# Patient Record
Sex: Female | Born: 2000 | Hispanic: No | Marital: Single | Smoking: Never smoker
Health system: Southern US, Community
[De-identification: ages and names within clinical notes are randomized; demographics above are authoritative.]

## PROBLEM LIST (undated history)

## (undated) DIAGNOSIS — F419 Anxiety disorder, unspecified: Secondary | ICD-10-CM

## (undated) DIAGNOSIS — F988 Other specified behavioral and emotional disorders with onset usually occurring in childhood and adolescence: Secondary | ICD-10-CM

## (undated) DIAGNOSIS — J45909 Unspecified asthma, uncomplicated: Secondary | ICD-10-CM

## (undated) DIAGNOSIS — G40909 Epilepsy, unspecified, not intractable, without status epilepticus: Secondary | ICD-10-CM

## (undated) DIAGNOSIS — Q02 Microcephaly: Secondary | ICD-10-CM

## (undated) DIAGNOSIS — F32A Depression, unspecified: Secondary | ICD-10-CM

## (undated) DIAGNOSIS — R6250 Unspecified lack of expected normal physiological development in childhood: Secondary | ICD-10-CM

## (undated) DIAGNOSIS — F959 Tic disorder, unspecified: Secondary | ICD-10-CM

## (undated) DIAGNOSIS — F70 Mild intellectual disabilities: Secondary | ICD-10-CM

## (undated) HISTORY — DX: Other specified behavioral and emotional disorders with onset usually occurring in childhood and adolescence: F98.8

## (undated) HISTORY — DX: Epilepsy, unspecified, not intractable, without status epilepticus: G40.909

## (undated) HISTORY — DX: Mild intellectual disabilities: F70

## (undated) HISTORY — DX: Microcephaly: Q02

## (undated) HISTORY — DX: Tic disorder, unspecified: F95.9

## (undated) HISTORY — DX: Unspecified lack of expected normal physiological development in childhood: R62.50

---
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
## 2022-01-23 ENCOUNTER — Ambulatory Visit: Payer: Medicaid Other | Admitting: Family

## 2022-02-13 ENCOUNTER — Encounter: Payer: Self-pay | Admitting: Family

## 2022-02-13 ENCOUNTER — Ambulatory Visit (INDEPENDENT_AMBULATORY_CARE_PROVIDER_SITE_OTHER): Payer: Medicaid Other | Admitting: Family

## 2022-02-13 VITALS — BP 118/75 | HR 99 | Ht <= 58 in | Wt 96.0 lb

## 2022-02-13 DIAGNOSIS — Z3042 Encounter for surveillance of injectable contraceptive: Secondary | ICD-10-CM | POA: Diagnosis not present

## 2022-02-13 MED ORDER — MEDROXYPROGESTERONE ACETATE 150 MG/ML IM SUSP
150.0000 mg | Freq: Once | INTRAMUSCULAR | Status: AC
  Administered 2022-02-13: 150 mg via INTRAMUSCULAR

## 2022-02-13 NOTE — Progress Notes (Unsigned)
Returns in depo window with no concerns for bleeding or cramping. Vitals reviewed.  Depo today; return in window or sooner if needed.    Wt Readings from Last 3 Encounters:  02/13/22 96 lb (43.5 kg) (1 %, Z= -2.19)*  12/01/21 90 lb 3.2 oz (40.9 kg) (<1 %, Z= -2.80)*  11/25/21 90 lb 6.4 oz (41 kg) (<1 %, Z= -2.78)*   * Growth percentiles are based on CDC (Girls, 2-20 Years) data.   Temp Readings from Last 3 Encounters:  04/04/21 98.3 F (36.8 C)  03/28/21 98.5 F (36.9 C) (Oral)   BP Readings from Last 3 Encounters:  02/13/22 118/75  12/01/21 120/78  04/18/21 127/76   Pulse Readings from Last 3 Encounters:  02/13/22 99  12/01/21 88  04/18/21 (!) 129

## 2022-03-06 ENCOUNTER — Ambulatory Visit (INDEPENDENT_AMBULATORY_CARE_PROVIDER_SITE_OTHER): Payer: Medicaid Other | Admitting: Pediatric Endocrinology

## 2022-03-20 ENCOUNTER — Ambulatory Visit (INDEPENDENT_AMBULATORY_CARE_PROVIDER_SITE_OTHER): Payer: Medicaid Other | Admitting: Pediatric Endocrinology

## 2022-05-02 ENCOUNTER — Ambulatory Visit (INDEPENDENT_AMBULATORY_CARE_PROVIDER_SITE_OTHER): Payer: Medicaid Other | Admitting: Family

## 2022-05-02 ENCOUNTER — Encounter: Payer: Self-pay | Admitting: Family

## 2022-05-02 VITALS — BP 115/78 | HR 94 | Ht <= 58 in | Wt 101.6 lb

## 2022-05-02 DIAGNOSIS — N9489 Other specified conditions associated with female genital organs and menstrual cycle: Secondary | ICD-10-CM

## 2022-05-02 DIAGNOSIS — Z3042 Encounter for surveillance of injectable contraceptive: Secondary | ICD-10-CM

## 2022-05-02 MED ORDER — MEDROXYPROGESTERONE ACETATE 150 MG/ML IM SUSP
150.0000 mg | Freq: Once | INTRAMUSCULAR | Status: AC
  Administered 2022-05-02: 150 mg via INTRAMUSCULAR

## 2022-05-03 ENCOUNTER — Encounter: Payer: Self-pay | Admitting: Family

## 2022-05-03 NOTE — Progress Notes (Signed)
Wt Readings from Last 3 Encounters:  05/02/22 101 lb 9.6 oz (46.1 kg)  02/13/22 96 lb (43.5 kg) (1 %, Z= -2.19)*  12/01/21 90 lb 3.2 oz (40.9 kg) (<1 %, Z= -2.80)*   * Growth percentiles are based on CDC (Girls, 2-20 Years) data.   BP Readings from Last 3 Encounters:  05/02/22 115/78   Pulse Readings from Last 3 Encounters:  05/02/22 94         Return in depo window. No concerns.  OK for depo today and return in depo window.

## 2022-06-20 ENCOUNTER — Other Ambulatory Visit: Payer: Self-pay | Admitting: Family

## 2022-06-20 DIAGNOSIS — E343 Short stature due to endocrine disorder, unspecified: Secondary | ICD-10-CM

## 2022-06-20 DIAGNOSIS — R630 Anorexia: Secondary | ICD-10-CM

## 2022-07-21 ENCOUNTER — Ambulatory Visit (INDEPENDENT_AMBULATORY_CARE_PROVIDER_SITE_OTHER): Payer: Medicaid Other | Admitting: Family

## 2022-07-21 ENCOUNTER — Encounter: Payer: Self-pay | Admitting: Family

## 2022-07-21 VITALS — BP 100/63 | HR 92 | Ht <= 58 in | Wt 105.2 lb

## 2022-07-21 DIAGNOSIS — Z3042 Encounter for surveillance of injectable contraceptive: Secondary | ICD-10-CM | POA: Diagnosis not present

## 2022-07-21 DIAGNOSIS — N9489 Other specified conditions associated with female genital organs and menstrual cycle: Secondary | ICD-10-CM

## 2022-07-21 MED ORDER — MEDROXYPROGESTERONE ACETATE 150 MG/ML IM SUSP
150.0000 mg | Freq: Once | INTRAMUSCULAR | Status: AC
  Administered 2022-07-21: 150 mg via INTRAMUSCULAR

## 2022-07-21 NOTE — Progress Notes (Signed)
Depo today with no concerns. Return in depo window.   Wt Readings from Last 3 Encounters:  07/21/22 105 lb 3.2 oz (47.7 kg)  05/02/22 101 lb 9.6 oz (46.1 kg)  02/13/22 96 lb (43.5 kg) (1 %, Z= -2.19)*   * Growth percentiles are based on CDC (Girls, 2-20 Years) data.   Temp Readings from Last 3 Encounters:  04/04/21 98.3 F (36.8 C)  03/28/21 98.5 F (36.9 C) (Oral)   BP Readings from Last 3 Encounters:  07/21/22 100/63  05/02/22 115/78  02/13/22 118/75   Pulse Readings from Last 3 Encounters:  07/21/22 92  05/02/22 94  02/13/22 99

## 2022-07-31 ENCOUNTER — Encounter (INDEPENDENT_AMBULATORY_CARE_PROVIDER_SITE_OTHER): Payer: Self-pay | Admitting: Pediatric Endocrinology

## 2022-07-31 ENCOUNTER — Ambulatory Visit (INDEPENDENT_AMBULATORY_CARE_PROVIDER_SITE_OTHER): Payer: Medicaid Other | Admitting: Pediatric Endocrinology

## 2022-07-31 VITALS — BP 110/68 | HR 84 | Ht <= 58 in | Wt 103.2 lb

## 2022-07-31 DIAGNOSIS — N926 Irregular menstruation, unspecified: Secondary | ICD-10-CM

## 2022-07-31 DIAGNOSIS — E300 Delayed puberty: Secondary | ICD-10-CM | POA: Diagnosis not present
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
## 2022-10-06 ENCOUNTER — Encounter: Payer: Medicaid Other | Admitting: Family

## 2022-10-09 ENCOUNTER — Encounter: Payer: Self-pay | Admitting: Family

## 2022-10-09 ENCOUNTER — Ambulatory Visit: Payer: Medicaid Other | Admitting: Family

## 2022-10-09 VITALS — BP 132/77 | HR 83 | Ht <= 58 in | Wt 100.2 lb

## 2022-10-09 DIAGNOSIS — R630 Anorexia: Secondary | ICD-10-CM

## 2022-10-09 DIAGNOSIS — Z3042 Encounter for surveillance of injectable contraceptive: Secondary | ICD-10-CM | POA: Diagnosis not present

## 2022-10-09 DIAGNOSIS — N9489 Other specified conditions associated with female genital organs and menstrual cycle: Secondary | ICD-10-CM

## 2022-10-09 DIAGNOSIS — E343 Short stature due to endocrine disorder, unspecified: Secondary | ICD-10-CM | POA: Diagnosis not present

## 2022-10-09 MED ORDER — MEDROXYPROGESTERONE ACETATE 150 MG/ML IM SUSP
150.0000 mg | Freq: Once | INTRAMUSCULAR | Status: AC
Start: 2022-10-09 — End: 2022-10-09
  Administered 2022-10-09: 150 mg via INTRAMUSCULAR

## 2022-10-09 MED ORDER — CYPROHEPTADINE HCL 4 MG PO TABS: ORAL_TABLET | ORAL | 3 refills | Status: AC

## 2022-10-09 NOTE — Progress Notes (Signed)
History was provided by the patient and legal guardian.  Sarah Dickerson is a 22 y.o. female who is here for depo-provera injection.   PCP confirmed? Yes.    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     atomoxetine (STRATTERA) 40 MG capsule Take 40 mg by mouth daily.     cetirizine (ZYRTEC) 10 MG tablet Take 10 mg by mouth daily.     cloNIDine (CATAPRES) 0.1 MG tablet Take 2 tablets (0.2 mg total) by mouth at bedtime. 60 tablet 2   cyproheptadine (PERIACTIN) 4 MG tablet Take 1 tablet (4 mg total) by mouth every morning AND 2 tablets (8 mg total) daily before supper. 270 tablet 3   fluticasone (FLONASE) 50 MCG/ACT nasal spray Place 1 spray into both nostrils daily.     ibuprofen (ADVIL,MOTRIN) 100 MG/5ML suspension Take 5 mg/kg by mouth every 6 (six) hours as needed.     medroxyPROGESTERone (DEPO-PROVERA) 150 MG/ML injection Inject 150 mg into the muscle every 3 (three) months.     montelukast (SINGULAIR) 5 MG chewable tablet Chew 5 mg by mouth daily.     Multiple Vitamins-Minerals (MULTIVITAMIN ADULTS) TABS Take by mouth.     mupirocin ointment (BACTROBAN) 2 % Apply  topically 2 (two) times daily.     PATADAY 0.2 % SOLN Eye Drops  11   risperiDONE (RISPERDAL) 0.25 MG tablet Take 1 tablet (0.25 mg total) by mouth at bedtime. 90 tablet 0   traZODone (DESYREL) 50 MG tablet Take 1 tablet (50 mg total) by mouth at bedtime. 90 tablet 0   triamcinolone (KENALOG) 0.1 % SMARTSIG:Sparingly Topical Twice Daily PRN     VYVANSE 40 MG capsule   0   No current facility-administered medications on file prior to visit.    No Known Allergies  Physical Exam:    Vitals:   10/09/22 0827  BP: 132/77  Pulse: 83  Weight: 100 lb 3.2 oz (45.5 kg)  Height: 4' 7.98" (1.422 m)    Growth %ile SmartLinks can only be used for patients less than 72 years old. No LMP recorded. Patient has had an injection.  Physical Exam Vitals and nursing note reviewed.  Constitutional:      General: She is not in acute distress.    Appearance: She is well-developed.  HENT:     Mouth/Throat:     Mouth: Mucous membranes are moist.  Eyes:     General: No scleral icterus.    Pupils: Pupils are equal, round, and reactive to light.  Neck:     Thyroid: No thyromegaly.  Cardiovascular:  Rate and Rhythm: Normal rate and regular rhythm.     Heart sounds: Normal heart sounds. No murmur heard. Pulmonary:     Effort: Pulmonary effort is normal.     Breath sounds: Normal breath sounds.  Musculoskeletal:        General: No tenderness. Normal range of motion.     Cervical back: Normal range of motion and neck supple.  Lymphadenopathy:     Cervical: No cervical adenopathy.  Skin:    General: Skin is warm and dry.     Findings: No rash.  Neurological:     Mental Status: She is alert and oriented to person, place, and time.     Cranial Nerves: No cranial nerve deficit.      Assessment/Plan: 1. Menstrual suppression 2. Encounter for Depo-Provera contraception -continue with method  -discussed referrals for ongoing care due to age - Dr Shawnie Pons, OB/GYN referral 07/31/22  3. Poor  appetite 4. Short stature due to endocrine disorder - cyproheptadine (PERIACTIN) 4 MG tablet; Take 1 tablet (4 mg total) by mouth every morning AND 2 tablets (8 mg total) daily before supper.  Dispense: 270 tablet; Refill: 3

## 2022-12-18 ENCOUNTER — Encounter: Payer: Self-pay | Admitting: Pediatrics

## 2022-12-18 ENCOUNTER — Ambulatory Visit (INDEPENDENT_AMBULATORY_CARE_PROVIDER_SITE_OTHER): Payer: Medicaid Other | Admitting: Family

## 2022-12-18 ENCOUNTER — Other Ambulatory Visit: Payer: Self-pay | Admitting: Family

## 2022-12-18 ENCOUNTER — Encounter: Payer: Self-pay | Admitting: Family

## 2022-12-18 VITALS — BP 123/84 | HR 109 | Ht <= 58 in | Wt 107.4 lb

## 2022-12-18 DIAGNOSIS — N9489 Other specified conditions associated with female genital organs and menstrual cycle: Secondary | ICD-10-CM | POA: Diagnosis not present

## 2022-12-18 DIAGNOSIS — Z3042 Encounter for surveillance of injectable contraceptive: Secondary | ICD-10-CM

## 2022-12-18 DIAGNOSIS — R630 Anorexia: Secondary | ICD-10-CM

## 2022-12-18 DIAGNOSIS — E343 Short stature due to endocrine disorder, unspecified: Secondary | ICD-10-CM

## 2022-12-18 MED ORDER — MEDROXYPROGESTERONE ACETATE 150 MG/ML IM SUSP
150.0000 mg | Freq: Once | INTRAMUSCULAR | Status: AC
Start: 2022-12-18 — End: 2022-12-18
  Administered 2022-12-18: 150 mg via INTRAMUSCULAR

## 2022-12-18 NOTE — Progress Notes (Signed)
Pt presents for depo injection. Pt within depo window, no urine hcg needed.  Injection given, tolerated well. F/u depo injection visit scheduled.  She is due for DXA; order placed.   Patient last 3 weights:  Wt Readings from Last 3 Encounters:  12/18/22 107 lb 6.4 oz (48.7 kg)  10/09/22 100 lb 3.2 oz (45.5 kg)  07/31/22 103 lb 3.2 oz (46.8 kg)     Last Blood Pressure:   BP Readings from Last 3 Encounters:  12/18/22 123/84  10/09/22 132/77  07/31/22 110/68

## 2022-12-29 ENCOUNTER — Telehealth: Payer: Self-pay

## 2022-12-29 NOTE — Telephone Encounter (Signed)
Tried calling patient to inform patient that she is due for a bone density scan (DXA) for prolonged depo use. That we need to screen every 2 years to ensure her bone health is not affected by the Depo use. No answer on either contact number LM on [phone] number for patient to call back.

## 2023-01-01 NOTE — Telephone Encounter (Signed)
Spoke with mother and patient who verbally understood patient is due for bone scan due to prolonged depo use. Per mom they will call to have an appointment scheduled for DXA number to imagining given to mother to schedule.

## 2023-03-12 ENCOUNTER — Other Ambulatory Visit: Payer: Self-pay | Admitting: Family

## 2023-03-12 ENCOUNTER — Telehealth: Payer: Self-pay | Admitting: Pediatrics

## 2023-03-12 DIAGNOSIS — N9489 Other specified conditions associated with female genital organs and menstrual cycle: Secondary | ICD-10-CM

## 2023-03-12 DIAGNOSIS — Z3042 Encounter for surveillance of injectable contraceptive: Secondary | ICD-10-CM

## 2023-03-12 NOTE — Telephone Encounter (Signed)
 Good morning Sarah Dickerson stated that patient initial referral was sent to [facility] for the bone density scan but they are booked, So grandmother made patient an appointment at [facility] at [facility]. Please send over referral to this location [address] [place], [place], [place] [address], [phone].  Thanks,

## 2023-03-20 ENCOUNTER — Ambulatory Visit (INDEPENDENT_AMBULATORY_CARE_PROVIDER_SITE_OTHER): Payer: Medicaid Other | Admitting: Family

## 2023-03-20 ENCOUNTER — Encounter: Payer: Self-pay | Admitting: Family

## 2023-03-20 VITALS — Ht <= 58 in | Wt 112.0 lb

## 2023-03-20 DIAGNOSIS — Z3042 Encounter for surveillance of injectable contraceptive: Secondary | ICD-10-CM | POA: Diagnosis not present

## 2023-03-20 MED ORDER — CALCIUM CARBONATE-VITAMIN D 500-5 MG-MCG PO TABS
ORAL_TABLET | ORAL | 0 refills | Status: AC
Start: 2023-03-20 — End: ?

## 2023-03-20 MED ORDER — MEDROXYPROGESTERONE ACETATE 150 MG/ML IM SUSP
150.0000 mg | Freq: Once | INTRAMUSCULAR | Status: AC
Start: 2023-03-20 — End: 2023-03-20
  Administered 2023-03-20: 150 mg via INTRAMUSCULAR

## 2023-03-20 NOTE — Progress Notes (Signed)
Pt presents for depo injection. Pt within depo window, no urine hcg needed.  Injection given, tolerated well. F/u depo injection visit scheduled.    Due for bone density: upcoming appt this Friday Will send in Vitamin D and calcium supplement to pharmacy   Patient last 3 weights:  Wt Readings from Last 3 Encounters:  03/20/23 112 lb (50.8 kg)  12/18/22 107 lb 6.4 oz (48.7 kg)  10/09/22 100 lb 3.2 oz (45.5 kg)     Last Blood Pressure:   BP Readings from Last 3 Encounters:  12/18/22 123/84  10/09/22 132/77  07/31/22 110/68

## 2023-03-23 ENCOUNTER — Ambulatory Visit (HOSPITAL_BASED_OUTPATIENT_CLINIC_OR_DEPARTMENT_OTHER)
Admission: RE | Admit: 2023-03-23 | Discharge: 2023-03-23 | Disposition: A | Discharge status: Home / Self Care | Payer: Medicaid Other | Source: Ambulatory Visit | Attending: Family | Admitting: Family

## 2023-03-23 DIAGNOSIS — N9489 Other specified conditions associated with female genital organs and menstrual cycle: Secondary | ICD-10-CM | POA: Insufficient documentation

## 2023-03-23 DIAGNOSIS — Z3042 Encounter for surveillance of injectable contraceptive: Secondary | ICD-10-CM | POA: Insufficient documentation

## 2023-06-11 ENCOUNTER — Encounter: Payer: Medicaid Other | Admitting: Family

## 2023-06-14 ENCOUNTER — Ambulatory Visit: Admitting: Family

## 2023-06-14 ENCOUNTER — Encounter: Payer: Self-pay | Admitting: Family

## 2023-06-14 VITALS — BP 118/73 | HR 113 | Ht <= 58 in | Wt 112.8 lb

## 2023-06-14 DIAGNOSIS — M8580 Other specified disorders of bone density and structure, unspecified site: Secondary | ICD-10-CM | POA: Diagnosis not present

## 2023-06-14 DIAGNOSIS — F70 Mild intellectual disabilities: Secondary | ICD-10-CM

## 2023-06-14 DIAGNOSIS — Z3042 Encounter for surveillance of injectable contraceptive: Secondary | ICD-10-CM | POA: Diagnosis not present

## 2023-06-14 DIAGNOSIS — N9489 Other specified conditions associated with female genital organs and menstrual cycle: Secondary | ICD-10-CM

## 2023-06-14 MED ORDER — MEDROXYPROGESTERONE ACETATE 150 MG/ML IM SUSP
150.0000 mg | Freq: Once | INTRAMUSCULAR | Status: AC
Start: 2023-06-14 — End: 2023-06-14
  Administered 2023-06-14: 150 mg via INTRAMUSCULAR

## 2023-06-14 MED ORDER — MEDROXYPROGESTERONE ACETATE 150 MG/ML IM SUSP
150.0000 mg | Freq: Once | INTRAMUSCULAR | Status: DC
Start: 2023-06-14 — End: 2023-06-14

## 2023-06-14 NOTE — Progress Notes (Signed)
 History was provided by the patient and grandmother.  Sarah Dickerson is a 23 y.o. female who is here for menstrual suppression 2/2 mild intellectual disability.   PCP confirmed? Yes.    Quita Skye, PA-C   Pertinent Labs/Imaging:  AP Spine L1-L4 03/23/2023 21.0 -2.6 0.899 g/cm2 DualFemur Total Left 03/23/2023 21.0 -2.2 0.817 g/cm2 DualFemur Total Right 03/23/2023 21.0 -2.5 0.774 g/cm2 Z-score of -2.6 is below expected range for age.    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     atomoxetine (STRATTERA) 40 MG capsule Take 40 mg by mouth daily.     Calcium Carbonate-Vitamin D 500-5 MG-MCG TABS Take one tablet by mouth daily for bone health. 90 tablet 0   cetirizine  (ZYRTEC) 10 MG tablet Take 10 mg by mouth daily.     cloNIDine (CATAPRES) 0.1 MG tablet Take 2 tablets (0.2 mg total) by mouth at bedtime. 60 tablet 2   cyproheptadine (PERIACTIN) 4 MG tablet Take 1 tablet (4 mg total) by mouth every morning AND 2 tablets (8 mg total) daily before supper. 270 tablet 3   fluticasone (FLONASE) 50 MCG/ACT nasal spray Place 1 spray into both nostrils daily.     ibuprofen (ADVIL,MOTRIN) 100 MG/5ML suspension Take 5 mg/kg by mouth every 6 (six) hours as needed.     montelukast (SINGULAIR) 5 MG chewable tablet Chew 5 mg by mouth daily.     Multiple Vitamins-Minerals (MULTIVITAMIN ADULTS) TABS Take by mouth.     mupirocin ointment (BACTROBAN) 2 % Apply topically 2 (two) times daily.     PATADAY 0.2 % SOLN Eye Drops  11   risperiDONE (RISPERDAL) 0.25 MG tablet Take 1 tablet (0.25 mg total) by mouth at bedtime. 90 tablet 0   traZODone (DESYREL) 50 MG tablet Take 1 tablet (50 mg total) by mouth at bedtime. 90 tablet 0   triamcinolone (KENALOG) 0.1 % SMARTSIG:Sparingly Topical Twice Daily PRN     VYVANSE 40 MG capsule  (Patient not taking: Reported on 06/14/2023)  0   No current facility-administered medications on file prior to visit.    No Known Allergies  Physical Exam:    Vitals:   06/14/23 1447  BP: 118/73  Pulse: Marland Kitchen)  113  Weight: 112 lb 12.8 oz (51.2 kg)  Height: 4' 7.91" (1.42 m)    Growth %ile SmartLinks can only be used for patients less than 6 years old. No LMP recorded. Patient has had an injection.  Physical Exam Constitutional:      General: She is not in acute distress.    Appearance: She is well-developed.  HENT:     Head: Normocephalic and atraumatic.  Eyes:     General: No scleral icterus.    Pupils: Pupils are equal, round, and reactive to light.  Neck:     Thyroid: No thyromegaly.  Cardiovascular:     Rate and Rhythm: Normal rate and regular rhythm.     Heart sounds: Normal heart sounds. No murmur heard. Pulmonary:     Effort:  Pulmonary effort is normal.     Breath sounds: Normal breath sounds.  Abdominal:     Palpations: Abdomen is soft.  Musculoskeletal:        General: Normal range of motion.     Cervical back: Normal range of motion and neck supple.  Lymphadenopathy:     Cervical: No cervical adenopathy.  Skin:    General: Skin is warm and dry.     Findings: No rash.  Neurological:     Mental Status: She is alert and oriented to person, place, and time.     Cranial Nerves: No cranial nerve deficit.     Motor: No tremor.  Psychiatric:        Attention and Perception: Attention normal.        Mood and Affect: Affect is tearful.        Speech: Speech normal.        Behavior: Behavior normal.        Thought Content: Thought content normal.        Judgment: Judgment normal.      Assessment/Plan:  1. Menstrual suppression (Primary) 2. Mild intellectual disability 3. Decreased bone density -discussed need to change method due to bone density suppression 2/2 prolonged depo use and also discussed concerns for being without contraceptive coverage; agreeable to referral to Dr Adriana Hopping (there is open referral from Dr Selena Daily from June 2024 that expires in June 2025); will place urgent referral today for IUD placement with sedation.  - Ambulatory referral to Obstetrics / Gynecology  4. Encounter for Depo-Provera contraception -Tequlia and grandmother Brenita Callow aware of risks associated with Depo use with known bone density suppression; encouraged Winter to continue taking medications as prescribed; Madiha identiied "promise" as their word to help her take medications when she does not want to take them; aware that this will be last depo injection; referral placed for OB/GYN.  - medroxyPROGESTERone (DEPO-PROVERA) injection 150 mg

## 2023-06-20 ENCOUNTER — Ambulatory Visit
Admission: RE | Admit: 2023-06-20 | Discharge: 2023-06-20 | Disposition: A | Discharge status: Home / Self Care | Source: Ambulatory Visit | Attending: Physician Assistant | Admitting: Physician Assistant

## 2023-06-20 ENCOUNTER — Other Ambulatory Visit: Payer: Self-pay | Admitting: Physician Assistant

## 2023-06-20 DIAGNOSIS — S76012A Strain of muscle, fascia and tendon of left hip, initial encounter: Secondary | ICD-10-CM

## 2023-08-30 ENCOUNTER — Encounter: Payer: MEDICAID | Admitting: Family

## 2023-09-03 ENCOUNTER — Ambulatory Visit (INDEPENDENT_AMBULATORY_CARE_PROVIDER_SITE_OTHER): Payer: MEDICAID | Admitting: Family Medicine

## 2023-09-03 ENCOUNTER — Other Ambulatory Visit: Payer: Self-pay

## 2023-09-03 ENCOUNTER — Encounter: Payer: Self-pay | Admitting: Family Medicine

## 2023-09-03 VITALS — BP 132/88 | HR 86 | Wt 115.0 lb

## 2023-09-03 DIAGNOSIS — Z1331 Encounter for screening for depression: Secondary | ICD-10-CM | POA: Diagnosis not present

## 2023-09-03 DIAGNOSIS — Z3009 Encounter for other general counseling and advice on contraception: Secondary | ICD-10-CM

## 2023-09-03 DIAGNOSIS — Z3042 Encounter for surveillance of injectable contraceptive: Secondary | ICD-10-CM

## 2023-09-03 DIAGNOSIS — F70 Mild intellectual disabilities: Secondary | ICD-10-CM | POA: Diagnosis not present

## 2023-09-03 MED ORDER — MEDROXYPROGESTERONE ACETATE 150 MG/ML IM SUSY
150.0000 mg | PREFILLED_SYRINGE | Freq: Once | INTRAMUSCULAR | Status: AC
  Administered 2023-09-03: 150 mg via INTRAMUSCULAR

## 2023-09-03 NOTE — Progress Notes (Signed)
   Subjective:    Patient ID: Sarah Dickerson is a 23 y.o. female presenting with Gynecologic Exam  on 09/03/2023  HPI: Here today to discuss contraception. Previously on OCP's which did not work well. Has been on Depo, which suppressed menstruation but has led to osteoporosis on recent DEXA. Needs pap smear.  Review of Systems  Constitutional:  Negative for chills and fever.  Respiratory:  Negative for shortness of breath.   Cardiovascular:  Negative for chest pain.  Gastrointestinal:  Negative for abdominal pain, nausea and vomiting.  Genitourinary:  Negative for dysuria.  Skin:  Negative for rash.      Objective:    BP 132/88   Pulse 86   Wt 115 lb (52.2 kg)   BMI 25.87 kg/m  Physical Exam Exam conducted with a chaperone present.  Constitutional:      General: She is not in acute distress.    Appearance: She is well-developed.  HENT:     Head: Normocephalic and atraumatic.  Eyes:     General: No scleral icterus. Cardiovascular:     Rate and Rhythm: Normal rate.  Pulmonary:     Effort: Pulmonary effort is normal.  Abdominal:     Palpations: Abdomen is soft.  Musculoskeletal:     Cervical back: Neck supple.  Skin:    General: Skin is warm and dry.  Neurological:     Mental Status: She is alert and oriented to person, place, and time.         Assessment & Plan:   Problem List Items Addressed This Visit       Unprioritized   Mild intellectual disability   Given this, will perform EUA with pap and IUD insertion.      Other Visit Diagnoses       Positive screening for depression on 9-item Patient Health Questionnaire (PHQ-9)    -  Primary   referral to [facility]   Relevant Orders   Ambulatory referral to Integrated Behavioral Health     Counseling for birth control regarding intrauterine device (IUD)       Will schedule IUD insertion under anesthesia.   Relevant Orders   Ambulatory Referral For Surgery Scheduling     Encounter for surveillance of injectable  contraceptive       given last Depo today, should last until surgery.   Relevant Medications   medroxyPROGESTERone  Acetate SUSY 150 mg (Completed)        Glenys GORMAN Birk, MD 09/03/2023 3:03 PM

## 2023-09-03 NOTE — Assessment & Plan Note (Signed)
 Given this, will perform EUA with pap and IUD insertion.

## 2023-09-11 NOTE — BH Specialist Note (Unsigned)
 Pt did not arrive to video visit and did not answer the phone; Left HIPPA-compliant message to call back Warren from [employer] for [employer] at [facility] at  [phone] [facility] office).  ; left MyChart message for patient.

## 2023-09-13 ENCOUNTER — Ambulatory Visit: Payer: MEDICAID | Admitting: Clinical

## 2023-09-26 ENCOUNTER — Telehealth: Payer: Self-pay

## 2023-09-26 ENCOUNTER — Telehealth: Payer: Self-pay | Admitting: Clinical

## 2023-09-26 NOTE — Telephone Encounter (Signed)
Attempt call regarding referral; Left HIPPA-compliant message to call back Mikle Sternberg from [facility] at [facility] at  [phone] (Ashelyn Mccravy's office).    

## 2023-09-26 NOTE — Telephone Encounter (Signed)
 I called the patient's mother to see if she's available for surgery on 10/09/23 at 1:30 pm at [facility]. I left a voicemail asking the mother to call me back to schedule at [phone].

## 2023-09-26 NOTE — Telephone Encounter (Signed)
 I spoke to patient's mother and she agreed to have the surgery scheduled w/ Dr. Fredirick on 10/09/23 at 1:30 pm. Mom, also agreed to arrive at 11:30 am for pre-op. Pre-op instructions and surgery details were provided by phone.

## 2023-09-28 DIAGNOSIS — Z124 Encounter for screening for malignant neoplasm of cervix: Secondary | ICD-10-CM

## 2023-09-28 DIAGNOSIS — Z30430 Encounter for insertion of intrauterine contraceptive device: Secondary | ICD-10-CM

## 2023-09-28 NOTE — H&P (Signed)
 Sarah Dickerson is an 23 y.o. G0P0000 female.   Chief Complaint: need for cycle control and pap HPI: Pt. With mild intellectual disability who needs pap and IUD insertion due to this and cannot tolerate in the office.   Past Medical History:  Diagnosis Date   Attention deficit disorder (ADD)    Intrauterine drug exposure    Microcephaly (HCC)    Mild mental retardation    Physical growth delay    Seizure disorder (HCC)    Simple tics     No past surgical history on file.  Family History  Adopted: Yes  Problem Relation Age of Onset   Mental retardation Mother    Alcohol abuse Mother    Depression Mother    Developmental delay Mother    Alcohol abuse Maternal Uncle    Developmental delay Maternal Uncle    Hypertension Maternal Grandmother    Diabetes Maternal Grandmother    Developmental delay Maternal Grandmother    Social History:  reports that she has never smoked. She has never used smokeless tobacco. No history on file for alcohol use and drug use.  Allergies: No Known Allergies  No medications prior to admission.    A comprehensive review of systems was negative.  There were no vitals taken for this visit. General appearance: alert, cooperative, and appears stated age Head: Normocephalic, without obvious abnormality, atraumatic Neck: supple, symmetrical, trachea midline Lungs: normal effort Heart: regular rate and rhythm Abdomen: soft, non-tender; bowel sounds normal; no masses,  no organomegaly    Assessment/Plan Active Problems:   Screening for cervical cancer   Encounter for IUD insertion  For EUA, pap smear, IUD insertion. Risks include but are not limited to bleeding, infection, injury to surrounding structures, including bowel, bladder and ureters, blood clots, and death.  Likelihood of success is high.    Sarah Dickerson 09/28/2023, 10:06 AM

## 2023-10-08 ENCOUNTER — Encounter (HOSPITAL_COMMUNITY): Payer: Self-pay | Admitting: Family Medicine

## 2023-10-08 ENCOUNTER — Other Ambulatory Visit: Payer: Self-pay

## 2023-10-08 NOTE — Progress Notes (Signed)
 SDW CALL  Patient was given pre-op instructions over the phone. The opportunity was given for the patient to ask questions. No further questions asked. Patient verbalized understanding of instructions given.   PCP - Buck Search, PA-C  Cardiologist -   PPM/ICD - denies Device Orders - n/a Rep Notified - n/a  Chest x-ray - denies EKG - denies Stress Test - denies ECHO - denies Cardiac Cath - denies  Sleep Study - denies CPAP - n/a  Dm -denies  Blood Thinner Instructions:denies Aspirin Instructions:n/a  ERAS Protcol -Clear liquids until 11:00 am.   COVID TEST- n/a   Anesthesia review: Yes, ADHS, mild intellectual disability. Per patient's mom tics present as jerking motion, no seizure for last couple of years  Patient denies shortness of breath, fever, cough and chest pain over the phone call   All instructions explained to the patient, with a verbal understanding of the material. Patient agrees to go over the instructions while at home for a better understanding.

## 2023-10-09 ENCOUNTER — Other Ambulatory Visit: Payer: Self-pay

## 2023-10-09 ENCOUNTER — Encounter (HOSPITAL_COMMUNITY): Payer: Self-pay | Admitting: Family Medicine

## 2023-10-09 ENCOUNTER — Other Ambulatory Visit: Payer: Self-pay | Admitting: Family Medicine

## 2023-10-09 ENCOUNTER — Encounter (HOSPITAL_COMMUNITY)
Admission: RE | Disposition: A | Discharge status: Home / Self Care | Payer: Self-pay | Source: Home / Self Care | Attending: Family Medicine

## 2023-10-09 ENCOUNTER — Ambulatory Visit (HOSPITAL_COMMUNITY): Payer: MEDICAID

## 2023-10-09 ENCOUNTER — Ambulatory Visit (HOSPITAL_COMMUNITY)
Admission: RE | Admit: 2023-10-09 | Discharge: 2023-10-09 | Disposition: A | Discharge status: Home / Self Care | Payer: MEDICAID | Attending: Family Medicine | Admitting: Family Medicine

## 2023-10-09 DIAGNOSIS — F32A Depression, unspecified: Secondary | ICD-10-CM | POA: Diagnosis not present

## 2023-10-09 DIAGNOSIS — J45909 Unspecified asthma, uncomplicated: Secondary | ICD-10-CM

## 2023-10-09 DIAGNOSIS — Z30430 Encounter for insertion of intrauterine contraceptive device: Secondary | ICD-10-CM | POA: Diagnosis present

## 2023-10-09 DIAGNOSIS — Z124 Encounter for screening for malignant neoplasm of cervix: Secondary | ICD-10-CM

## 2023-10-09 DIAGNOSIS — G40909 Epilepsy, unspecified, not intractable, without status epilepticus: Secondary | ICD-10-CM | POA: Diagnosis not present

## 2023-10-09 DIAGNOSIS — F70 Mild intellectual disabilities: Secondary | ICD-10-CM | POA: Diagnosis not present

## 2023-10-09 DIAGNOSIS — Q02 Microcephaly: Secondary | ICD-10-CM | POA: Diagnosis not present

## 2023-10-09 DIAGNOSIS — F418 Other specified anxiety disorders: Secondary | ICD-10-CM

## 2023-10-09 DIAGNOSIS — F419 Anxiety disorder, unspecified: Secondary | ICD-10-CM | POA: Diagnosis not present

## 2023-10-09 HISTORY — PX: EXAM UNDER ANESTHESIA, PELVIC: SHX7461

## 2023-10-09 HISTORY — DX: Depression, unspecified: F32.A

## 2023-10-09 HISTORY — DX: Anxiety disorder, unspecified: F41.9

## 2023-10-09 HISTORY — PX: INTRAUTERINE DEVICE (IUD) INSERTION: SHX5877

## 2023-10-09 HISTORY — DX: Unspecified asthma, uncomplicated: J45.909

## 2023-10-09 LAB — BASIC METABOLIC PANEL WITH GFR
Anion gap: 12 (ref 5–15)
BUN: 8 mg/dL (ref 6–20)
CO2: 20 mmol/L — ABNORMAL LOW (ref 22–32)
Calcium: 9.8 mg/dL (ref 8.9–10.3)
Chloride: 103 mmol/L (ref 98–111)
Creatinine, Ser: 0.93 mg/dL (ref 0.44–1.00)
GFR, Estimated: >60 mL/min (ref >60)
Glucose, Bld: 76 mg/dL (ref 70–99)
Potassium: 4.5 mmol/L (ref 3.5–5.1)
Sodium: 135 mmol/L (ref 135–145)

## 2023-10-09 LAB — CBC
HCT: 42.5 % (ref 36.0–46.0)
Hemoglobin: 13.8 g/dL (ref 12.0–15.0)
MCH: 28.9 pg (ref 26.0–34.0)
MCHC: 32.5 g/dL (ref 30.0–36.0)
MCV: 89.1 fL (ref 80.0–100.0)
Platelets: 212 K/uL (ref 150–400)
RBC: 4.77 MIL/uL (ref 3.87–5.11)
RDW: 11.5 % (ref 11.5–15.5)
WBC: 7.6 K/uL (ref 4.0–10.5)
nRBC: 0 % (ref 0.0–0.2)

## 2023-10-09 LAB — POCT PREGNANCY, URINE: Preg Test, Ur: NEGATIVE

## 2023-10-09 SURGERY — EXAM UNDER ANESTHESIA, PELVIC
Anesthesia: Monitor Anesthesia Care | Site: "Vagina "

## 2023-10-09 MED ORDER — LIDOCAINE 2% (20 MG/ML) 5 ML SYRINGE
INTRAMUSCULAR | Status: DC | PRN
  Administered 2023-10-09 (×2): 40 mg via INTRAVENOUS

## 2023-10-09 MED ORDER — ORAL CARE MOUTH RINSE: 15.0000 mL | Freq: Once | OROMUCOSAL | Status: AC

## 2023-10-09 MED ORDER — FENTANYL CITRATE (PF) 250 MCG/5ML IJ SOLN
INTRAMUSCULAR | Status: AC
  Filled 2023-10-09: qty 5

## 2023-10-09 MED ORDER — MIDAZOLAM HCL 2 MG/2ML IJ SOLN
INTRAMUSCULAR | Status: DC | PRN
  Administered 2023-10-09 (×2): 2 mg via INTRAVENOUS

## 2023-10-09 MED ORDER — LACTATED RINGERS IV SOLN: INTRAVENOUS | Status: DC

## 2023-10-09 MED ORDER — ONDANSETRON HCL 4 MG/2ML IJ SOLN
INTRAMUSCULAR | Status: AC
  Filled 2023-10-09: qty 2

## 2023-10-09 MED ORDER — ACETAMINOPHEN 500 MG PO TABS
1000.0000 mg | ORAL_TABLET | ORAL | Status: AC
  Administered 2023-10-09 (×2): 1000 mg via ORAL
  Filled 2023-10-09: qty 2

## 2023-10-09 MED ORDER — CHLORHEXIDINE GLUCONATE 0.12 % MT SOLN
15.0000 mL | Freq: Once | OROMUCOSAL | Status: AC
  Administered 2023-10-09 (×2): 15 mL via OROMUCOSAL
  Filled 2023-10-09: qty 15

## 2023-10-09 MED ORDER — KETOROLAC TROMETHAMINE 15 MG/ML IJ SOLN
INTRAMUSCULAR | Status: DC | PRN
  Administered 2023-10-09 (×2): 15 mg via INTRAVENOUS

## 2023-10-09 MED ORDER — ROCURONIUM BROMIDE 10 MG/ML (PF) SYRINGE
PREFILLED_SYRINGE | INTRAVENOUS | Status: AC
  Filled 2023-10-09: qty 10

## 2023-10-09 MED ORDER — LEVONORGESTREL 20 MCG/DAY IU IUD
INTRAUTERINE_SYSTEM | INTRAUTERINE | Status: AC
  Filled 2023-10-09: qty 1

## 2023-10-09 MED ORDER — ONDANSETRON HCL 4 MG/2ML IJ SOLN
INTRAMUSCULAR | Status: DC | PRN
  Administered 2023-10-09 (×2): 4 mg via INTRAVENOUS

## 2023-10-09 MED ORDER — POVIDONE-IODINE 10 % EX SWAB
2.0000 | Freq: Once | CUTANEOUS | Status: AC
  Administered 2023-10-09 (×2): 2 via TOPICAL

## 2023-10-09 MED ORDER — MIDAZOLAM HCL 2 MG/2ML IJ SOLN
INTRAMUSCULAR | Status: AC
Start: 2023-10-09 — End: 2023-10-09
  Filled 2023-10-09: qty 2

## 2023-10-09 MED ORDER — LEVONORGESTREL 1.5 MG PO TABS
ORAL_TABLET | ORAL | Status: DC | PRN
  Administered 2023-10-09 (×2): 52 mg

## 2023-10-09 MED ORDER — FENTANYL CITRATE (PF) 250 MCG/5ML IJ SOLN
INTRAMUSCULAR | Status: DC | PRN
  Administered 2023-10-09 (×4): 50 ug via INTRAVENOUS

## 2023-10-09 MED ORDER — PROPOFOL 10 MG/ML IV BOLUS
INTRAVENOUS | Status: DC | PRN
  Administered 2023-10-09: 10 mg via INTRAVENOUS
  Administered 2023-10-09: 20 mg via INTRAVENOUS
  Administered 2023-10-09: 10 mg via INTRAVENOUS
  Administered 2023-10-09 (×3): 20 mg via INTRAVENOUS
  Administered 2023-10-09: 40 mg via INTRAVENOUS
  Administered 2023-10-09 (×2): 10 mg via INTRAVENOUS
  Administered 2023-10-09 (×2): 20 mg via INTRAVENOUS
  Administered 2023-10-09 (×2): 10 mg via INTRAVENOUS
  Administered 2023-10-09: 40 mg via INTRAVENOUS
  Administered 2023-10-09 (×2): 10 mg via INTRAVENOUS

## 2023-10-09 MED ORDER — KETOROLAC TROMETHAMINE 30 MG/ML IJ SOLN
INTRAMUSCULAR | Status: AC
  Filled 2023-10-09: qty 1

## 2023-10-09 MED ORDER — LIDOCAINE 2% (20 MG/ML) 5 ML SYRINGE
INTRAMUSCULAR | Status: AC
  Filled 2023-10-09: qty 5

## 2023-10-09 MED ORDER — PROPOFOL 10 MG/ML IV BOLUS
INTRAVENOUS | Status: AC
  Filled 2023-10-09: qty 20

## 2023-10-09 SURGICAL SUPPLY — 4 items
COVER BACK TABLE 60X90IN (DRAPES) ×2 IMPLANT
GLOVE BIO SURGEON STRL SZ7 (GLOVE) ×2 IMPLANT
GLOVE BIOGEL PI IND STRL 7.0 (GLOVE) ×4 IMPLANT
GOWN STRL REUS W/ TWL LRG LVL3 (GOWN DISPOSABLE) ×4 IMPLANT

## 2023-10-09 NOTE — Transfer of Care (Signed)
 Immediate Anesthesia Transfer of Care Note  Patient: Sarah Dickerson  Procedure(s) Performed: ERASMO UNDER ANESTHESIA, PELVIC W/PAP SMEAR (Vagina ) INSERTION, INTRAUTERINE DEVICE (Vagina )  Patient Location: PACU  Anesthesia Type:MAC  Level of Consciousness: sedated, drowsy, and responds to stimulation  Airway & Oxygen Therapy: Patient Spontanous Breathing and Patient connected to nasal cannula oxygen  Post-op Assessment: Report given to RN and Post -op Vital signs reviewed and stable  Post vital signs: Reviewed and stable  Last Vitals:  Vitals Value Taken Time  BP 118/75 10/09/23 14:31  Temp    Pulse 85 10/09/23 14:33  Resp 21 10/09/23 14:33  SpO2 95 % 10/09/23 14:33  Vitals shown include unfiled device data.  Last Pain:  Vitals:   10/09/23 1208  PainSc: 0-No pain         Complications: No notable events documented.

## 2023-10-09 NOTE — BH Specialist Note (Deleted)
 Integrated Behavioral Health via Telemedicine Visit  10/09/2023 Sarah Dickerson [id number]  Number of Integrated Behavioral Health Clinician visits: No data recorded Session Start time: No data recorded  Session End time: No data recorded Total time in minutes: No data recorded   Referring Provider: Glenys Birk, MD Patient/Family location: Home*** [facility] Provider location: [facility]  All persons participating in visit: Patient Sarah Dickerson and [facility] Nawal Burling ***  Types of Service: {CHL AMB TYPE OF SERVICE:[phone]}  I connected with Squire JONETTA Molt and/or Gioia D Slaugh's {family members:20773} via  Telephone or Video Enabled Telemedicine Application  (Video is Caregility application) and verified that I am speaking with the correct person using two identifiers. Discussed confidentiality: Yes   I discussed the limitations of telemedicine and the availability of in person appointments.  Discussed there is a possibility of technology failure and discussed alternative modes of communication if that failure occurs.  I discussed that engaging in this telemedicine visit, they consent to the provision of behavioral healthcare and the services will be billed under their insurance.  Patient and/or legal guardian expressed understanding and consented to Telemedicine visit: Yes   Presenting Concerns: Patient and/or family reports the following symptoms/concerns: *** Duration of problem: ***; Severity of problem: {Mild/Moderate/Severe:20260}  Patient and/or Family's Strengths/Protective Factors: {CHL AMB BH PROTECTIVE FACTORS:4014603404}  Goals Addressed: Patient will:  Reduce symptoms of: {IBH Symptoms:21014056}   Increase knowledge and/or ability of: {IBH Patient Tools:21014057}   Demonstrate ability to: {IBH Goals:21014053}  Progress towards Goals: {CHL AMB BH PROGRESS TOWARDS GOALS:[phone]}    Interventions: Interventions  utilized:  {IBH Interventions:21014054} Standardized Assessments completed: {IBH Screening Tools:21014051}    Patient and/or Family Response: Patient agrees with treatment plan.   Clinical Assessment/Diagnosis  No diagnosis found. .   Patient may benefit from psychoeducation and brief therapeutic interventions regarding coping with symptoms of *** .  Plan: Follow up with behavioral health clinician on : *** Behavioral recommendations:  -*** -*** Referral(s): {IBH Referrals:21014055}  I discussed the assessment and treatment plan with the patient and/or parent/guardian. They were provided an opportunity to ask questions and all were answered. They agreed with the plan and demonstrated an understanding of the instructions.   They were advised to call back or seek an in-person evaluation if the symptoms worsen or if the condition fails to improve as anticipated.  Warren JAYSON Mering, LCSW     09/03/2023   10:11 AM 11/25/2021   10:46 AM 03/30/2021    7:22 PM  Depression screen PHQ 2/9  Decreased Interest 3 0 0  Down, Depressed, Hopeless 2 0 0  PHQ - 2 Score 5 0 0  Altered sleeping 3 0 1  Tired, decreased energy 2 1 1   Change in appetite 2 0 0  Feeling bad or failure about yourself  2 0 0  Trouble concentrating 2 0 0  Moving slowly or fidgety/restless 2 1 1   Suicidal thoughts 3    PHQ-9 Score 21 2 3       09/03/2023   10:12 AM 11/25/2021   10:46 AM 03/30/2021    7:22 PM  GAD 7 : Generalized Anxiety Score  Nervous, Anxious, on Edge 3 1 2   Control/stop worrying 2 0 1  Worry too much - different things 2 1 2   Trouble relaxing 2 0 0  Restless 2 1 1   Easily annoyed or irritable 2 1 3   Afraid - awful might happen 2 0 0  Total  GAD 7 Score 15 4 9

## 2023-10-09 NOTE — Anesthesia Postprocedure Evaluation (Signed)
 Anesthesia Post Note  Patient: Sarah Dickerson  Procedure(s) Performed: EXAM UNDER ANESTHESIA, PELVIC W/PAP SMEAR (Vagina ) INSERTION, INTRAUTERINE DEVICE (Vagina )     Patient location during evaluation: PACU Anesthesia Type: MAC Level of consciousness: awake and alert Pain management: pain level controlled Vital Signs Assessment: post-procedure vital signs reviewed and stable Respiratory status: spontaneous breathing, nonlabored ventilation, respiratory function stable and patient connected to nasal cannula oxygen Cardiovascular status: stable and blood pressure returned to baseline Postop Assessment: no apparent nausea or vomiting Anesthetic complications: no   No notable events documented.  Last Vitals:  Vitals:   10/09/23 1454 10/09/23 1456  BP: 128/75   Pulse: 76 74  Resp: (!) 22 (!) 22  Temp:  36.5 C  SpO2: 97% 96%    Last Pain:  Vitals:   10/09/23 1456  PainSc: 0-No pain                 Rome Ade

## 2023-10-09 NOTE — Anesthesia Preprocedure Evaluation (Signed)
 Anesthesia Evaluation  Patient identified by MRN, date of birth, ID band Patient awake  General Assessment Comment:  Patient AO x 3, but some element of intellectual disability is apparent; she defers to her grandmother/HCP at bedside for answers to many questions.  Reviewed: Allergy & Precautions, NPO status , Patient's Chart, lab work & pertinent test results  History of Anesthesia Complications Negative for: history of anesthetic complications  Airway Mallampati: II  TM Distance: >3 FB Neck ROM: Full    Dental no notable dental hx. (+) Teeth Intact   Pulmonary neg pulmonary ROS, neg sleep apnea, neg COPD, Patient abstained from smoking.Not current smoker   Pulmonary exam normal breath sounds clear to auscultation       Cardiovascular Exercise Tolerance: Good METS(-) hypertension(-) CAD and (-) Past MI negative cardio ROS (-) dysrhythmias  Rhythm:Regular Rate:Normal - Systolic murmurs    Neuro/Psych Seizures -,  PSYCHIATRIC DISORDERS Anxiety Depression       GI/Hepatic ,neg GERD  ,,(+)     (-) substance abuse    Endo/Other  neg diabetes    Renal/GU negative Renal ROS     Musculoskeletal   Abdominal   Peds  Hematology   Anesthesia Other Findings Past Medical History: No date: Anxiety No date: Asthma No date: Attention deficit disorder (ADD) No date: Depression 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 No date: Seizure disorder (HCC) No date: Simple tics  Reproductive/Obstetrics                              Anesthesia Physical Anesthesia Plan  ASA: 2  Anesthesia Plan: MAC   Post-op Pain Management: Minimal or no pain anticipated, Tylenol  PO (pre-op)* and Toradol  IV (intra-op)*   Induction: Intravenous  PONV Risk Score and Plan: 3 and Propofol  infusion, TIVA, Ondansetron , Dexamethasone  and Midazolam   Airway Management Planned: Nasal Cannula and Natural Airway  Additional Equipment: None  Intra-op Plan:   Post-operative Plan:   Informed Consent: I have reviewed the patients History and Physical, chart, labs and discussed the procedure including the risks, benefits and alternatives for the proposed anesthesia with the patient or authorized representative who has indicated his/her understanding and acceptance.     Dental advisory given and Consent reviewed with POA  Plan Discussed with: CRNA and Surgeon  Anesthesia Plan Comments: (Discussed risks of anesthesia with patient as well as her grandmother/HCP, including possibility of difficulty with spontaneous ventilation under anesthesia necessitating airway intervention, PONV, and rare risks such as cardiac or respiratory or neurological events, and allergic reactions. Discussed the role of CRNA in patient's perioperative care. Grandmother and patient understands.)        Anesthesia Quick Evaluation

## 2023-10-09 NOTE — Interval H&P Note (Signed)
 History and Physical Interval Note:  10/09/2023 1:27 PM  Sarah Dickerson  has presented today for surgery, with the diagnosis of IUD insertion.  The various methods of treatment have been discussed with the patient and family. After consideration of risks, benefits and other options for treatment, the patient has consented to  Procedure(s): EXAM UNDER ANESTHESIA, PELVIC (N/A) INSERTION, INTRAUTERINE DEVICE (N/A) as a surgical intervention.  The patient's history has been reviewed, patient examined, no change in status, stable for surgery.  I have reviewed the patient's chart and labs.  Questions were answered to the patient's satisfaction.     Glenys GORMAN Birk

## 2023-10-09 NOTE — Op Note (Signed)
 Preoperative diagnosis: Unable to tolerate exam in the office, Need for Pap smear screening, need for cycle control  Postoperative diagnosis: Same  Procedure: Exam under anesthesia, Pap smear, placement of Mirena  IUD  Surgeon: Glenys Birk, M.D.  Anesthesia: MAC, Boone Fess, MD  Findings: 6 week week size uterus, normal appearing cervix  Estimated blood loss: Less than 25 cc  Specimen: None  Reason for procedure: Patient is a 23 y.o. G0P0000 patient who is unable to tolerate exam in the office. She has never had a Pap smear and requests screening.  Procedure: Patient taken to the OR where she was placed in dorsal lithotomy in Allen stirrups. A timeout was performed. A speculum was placed inside the vagina. The cervix was visualized and Pap smear obtained. Mirena  IUD placed. Uterus sounded to 6 cm. Strings trimmed to 2.5 cm. The speculum was removed from the vagina. The patient was awakened and taken to recovery, stable.  BIRK GLENYS RAMAN, MD 10/09/2023 2:23 PM

## 2023-10-10 ENCOUNTER — Encounter (HOSPITAL_COMMUNITY): Payer: Self-pay | Admitting: Family Medicine

## 2023-10-10 LAB — CYTOLOGY - PAP: Diagnosis: NEGATIVE

## 2023-10-11 ENCOUNTER — Ambulatory Visit: Payer: Self-pay | Admitting: Family Medicine

## 2023-10-16 ENCOUNTER — Encounter (HOSPITAL_COMMUNITY): Payer: Self-pay | Admitting: Family Medicine

## 2023-10-16 NOTE — BH Specialist Note (Unsigned)
 Integrated Behavioral Health via Telemedicine Visit  10/17/2023 Sarah Dickerson [id number]  Number of Integrated Behavioral Health Clinician visits: 1- Initial Visit  Session Start time: 0815   Session End time: 0830  Total time in minutes: 15    Referring Provider: Glenys Birk, MD Patient/Family location: Home [facility] Provider location: [facility]  All persons participating in visit: Patient Sarah Dickerson and Sarah Dickerson and patient's mother/legal guardian, Sarah Dickerson  Types of Service: Individual psychotherapy and Video visit  I connected with Sarah Dickerson and/or Sarah Dickerson's mother via  Telephone or Video Enabled Telemedicine Application  (Video is Caregility application) and verified that I am speaking with the correct person using two identifiers. Discussed confidentiality: Yes   I discussed the limitations of telemedicine and the availability of in person appointments.  Discussed there is a possibility of technology failure and discussed alternative modes of communication if that failure occurs.  I discussed that engaging in this telemedicine visit, they consent to the provision of behavioral healthcare and the services will be billed under their insurance.  Patient and/or legal guardian expressed understanding and consented to Telemedicine visit: Yes   Presenting Concerns: Patient and/or family reports the following symptoms/concerns: Pt has experienced chronic anxiety and depression all my life, is being treated at [facility] for years, has good support at home; is looking forward to culinary classes soon.  Duration of problem: Ongoing; Severity of problem: severe  Patient and/or Family's Strengths/Protective Factors: Social connections and Concrete supports in place (healthy food, safe environments, etc.)  Goals Addressed: Patient will:  Reduce symptoms of: anxiety and depression   Increase knowledge and/or  ability of: healthy habits   Demonstrate ability to: Increase motivation to adhere to plan of care  Progress towards Goals: Ongoing    Interventions: Interventions utilized:  Psychoeducation and/or Health Education and Supportive Reflection Standardized Assessments completed: Not Needed    Patient and/or Family Response: Patient agrees with treatment plan.   Clinical Assessment/Diagnosis  Mild intellectual disability  Attention deficit hyperactivity disorder (ADHD), predominantly inattentive type .   Patient may benefit from psychoeducation and brief therapeutic interventions regarding coping with symptoms of depression and anxiety .  Plan: Follow up with behavioral healh  on : Call Sarah Dickerson at [phone], as needed. Behavioral recommendations:  -Continue attending [facility] appointments; follow recommendations -Continue prioritizing healthy self-care (regular meals, adequate rest; allowing practical help from supportive friends and family)  -Read through information on After Visit Summary; use as needed and discussed -Continue plan to attend upcoming Culinary Arts Program at [facility] Referral(s): Integrated Art gallery manager (In Clinic) and [facility] (LME/Outside Clinic)  I discussed the assessment and treatment plan with the patient and/or parent/guardian. They were provided an opportunity to ask questions and all were answered. They agreed with the plan and demonstrated an understanding of the instructions.   They were advised to call back or seek an in-person evaluation if the symptoms worsen or if the condition fails to improve as anticipated.  Sarah JAYSON Mering, LCSW     09/03/2023   10:11 AM 11/25/2021   10:46 AM 03/30/2021    7:22 PM  Depression screen PHQ 2/9  Decreased Interest 3 0 0  Down, Depressed, Hopeless 2 0 0  PHQ - 2 Score 5 0 0  Altered sleeping 3 0 1  Tired, decreased energy 2 1 1   Change in appetite 2 0 0  Feeling bad or  failure  about yourself  2 0 0  Trouble concentrating 2 0 0  Moving slowly or fidgety/restless 2 1 1   Suicidal thoughts 3    PHQ-9 Score 21 2 3       09/03/2023   10:12 AM 11/25/2021   10:46 AM 03/30/2021    7:22 PM  GAD 7 : Generalized Anxiety Score  Nervous, Anxious, on Edge 3 1 2   Control/stop worrying 2 0 1  Worry too much - different things 2 1 2   Trouble relaxing 2 0 0  Restless 2 1 1   Easily annoyed or irritable 2 1 3   Afraid - awful might happen 2 0 0  Total GAD 7 Score 15 4 9

## 2023-10-17 ENCOUNTER — Ambulatory Visit: Payer: MEDICAID | Admitting: Clinical

## 2023-10-17 DIAGNOSIS — F900 Attention-deficit hyperactivity disorder, predominantly inattentive type: Secondary | ICD-10-CM

## 2023-10-17 DIAGNOSIS — F70 Mild intellectual disabilities: Secondary | ICD-10-CM

## 2023-10-17 NOTE — Patient Instructions (Signed)
 [facility] [address] [place], [place] [address] [phone] (main office) [phone] [facility] office)    [facility]  [address] [place], [place] [address] [phone] or [phone] [facility] 24/7 FOR ANYONE [address] [place], [place]  [phone] Fax: [phone] [email or web address] *Interpreters available *Accepts all insurance and uninsured for Urgent Care needs *Accepts Medicaid and uninsured for outpatient treatment (below)    ONLY FOR [facility]  Below:   Outpatient New Patient Assessment/Therapy Walk-ins:        Monday -Thursday 8am until slots are full.        Every Friday 1pm-4pm  (first come, first served)                   New Patient Psychiatry/Medication Management        Monday-Friday 8am-11am (first come, first served)              For all walk-ins we ask that you arrive by 7:15am, because patients will be seen in the order of arrival.   [employer] and Websites Here are a few free apps meant to help you to help yourself.  To find, try searching on the internet to see if the app is offered on Apple/Android devices. If your first choice doesn't come up on your device, the good news is that there are many choices! Play around with different apps to see which ones are helpful to you.    Calm This is an app meant to help increase calm feelings. Includes info, strategies, and tools for tracking your feelings.      Calm Harm  This app is meant to help with self-harm. Provides many 5-minute or 15-min coping strategies for doing instead of hurting yourself.       Healthy Minds Health Minds is a problem-solving tool to help deal with emotions and cope with stress you encounter wherever you are.      MindShift This app can help people cope with anxiety. Rather than trying to avoid anxiety, you can make an important shift and face  it.      MY3  MY3 features a support system, safety plan and resources with the goal of offering a tool to use in a time of need.       My Life My Voice  This mood journal offers a simple solution for tracking your thoughts, feelings and moods. Animated emoticons can help identify your mood.       Relax Melodies Designed to help with sleep, on this app you can mix sounds and meditations for relaxation.      Smiling Mind Smiling Mind is meditation made easy: it's a simple tool that helps put a smile on your mind.        Stop, Breathe & Think  A friendly, simple guide for people through meditations for mindfulness and compassion.  Stop, Breathe and Think Kids Enter your current feelings and choose a "mission" to help you cope. Offers videos for certain moods instead of just sound recordings.       Team Orange The goal of this tool is to help teens change how they think, act, and react. This app helps you focus on your own good feelings and experiences.      The [employer] Box The [employer] (VHB) contains simple tools to help patients with coping, relaxation, distraction, and positive thinking.

## 2023-10-31 ENCOUNTER — Encounter: Payer: Self-pay | Admitting: Family Medicine

## 2023-10-31 ENCOUNTER — Other Ambulatory Visit: Payer: Self-pay

## 2023-10-31 ENCOUNTER — Ambulatory Visit: Payer: MEDICAID | Admitting: Family Medicine

## 2023-10-31 VITALS — BP 117/78 | HR 68 | Wt 113.0 lb

## 2023-10-31 DIAGNOSIS — Z23 Encounter for immunization: Secondary | ICD-10-CM | POA: Diagnosis not present

## 2023-10-31 DIAGNOSIS — Z09 Encounter for follow-up examination after completed treatment for conditions other than malignant neoplasm: Secondary | ICD-10-CM

## 2023-10-31 NOTE — Progress Notes (Signed)
   Subjective:    Patient ID: Sarah Dickerson is a 23 y.o. female presenting with Post-op Follow-up  on 10/31/2023  HPI: Here for post op check. S/p EUA, pap, IUD insertion. No bleeding, no cramping.  Review of Systems  Constitutional:  Negative for chills and fever.  Respiratory:  Negative for shortness of breath.   Cardiovascular:  Negative for chest pain.  Gastrointestinal:  Negative for abdominal pain, nausea and vomiting.  Genitourinary:  Negative for dysuria.  Skin:  Negative for rash.      Objective:    BP 117/78   Pulse 68   Wt 113 lb (51.3 kg)   BMI 26.26 kg/m  Physical Exam Exam conducted with a chaperone present.  Constitutional:      General: She is not in acute distress.    Appearance: She is well-developed.  HENT:     Head: Normocephalic and atraumatic.  Eyes:     General: No scleral icterus. Cardiovascular:     Rate and Rhythm: Normal rate.  Pulmonary:     Effort: Pulmonary effort is normal.  Abdominal:     Palpations: Abdomen is soft.  Musculoskeletal:     Cervical back: Neck supple.  Skin:    General: Skin is warm and dry.  Neurological:     Mental Status: She is alert and oriented to person, place, and time.         Assessment & Plan:  Need for immunization against influenza - Plan: Flu vaccine trivalent PF, 6mos and older(Flulaval,Afluria,Fluarix,Fluzone)  Postop check - Doing well  No follow-ups on file.  Glenys GORMAN Birk, MD 10/31/2023 8:37 AM

## 2023-11-19 ENCOUNTER — Other Ambulatory Visit: Payer: Self-pay

## 2023-11-19 ENCOUNTER — Ambulatory Visit: Payer: MEDICAID | Admitting: *Deleted

## 2023-11-19 VITALS — BP 100/83 | HR 82 | Ht <= 58 in | Wt 113.1 lb

## 2023-11-19 DIAGNOSIS — Z0130 Encounter for examination of blood pressure without abnormal findings: Secondary | ICD-10-CM

## 2023-11-19 NOTE — Progress Notes (Signed)
 Appointment scheduled for depo-provera  but patient had IUD inserted and per Dr. Alonzo notes plan was depo-provera  until surgery of IUD insertion. Appointment had been scheduled in July before surgery done.  I called Sarah Dickerson and her Mother back and we discussed she did not need depo-provera  now. I asked if she was having any bleeding or issues with the IUD and she verified she is not having any issues. Bp wnl today. Advised will need annual exam in July 2026. She and her Mother voice understanding. Rock Skip PEAK

## 2023-12-31 ENCOUNTER — Encounter: Payer: Self-pay | Admitting: Radiology

## 2024-02-18 MED FILL — Levonorgestrel IUD 20 MCG/DAY (Initial) (52 MG Total): INTRAUTERINE | Qty: 1 | Status: AC
